# Patient Record
Sex: Male | Born: 1995 | Race: Black or African American | Hispanic: No | Marital: Single | Smoking: Current every day smoker
Health system: Southern US, Community
[De-identification: ages and names within clinical notes are randomized; demographics above are authoritative.]

## PROBLEM LIST (undated history)

## (undated) DIAGNOSIS — D573 Sickle-cell trait: Secondary | ICD-10-CM

## (undated) HISTORY — PX: HERNIA REPAIR: SHX51

## (undated) HISTORY — PX: EYE SURGERY: SHX253

---
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
## 2016-02-28 ENCOUNTER — Emergency Department (HOSPITAL_COMMUNITY): Payer: Self-pay

## 2016-02-28 ENCOUNTER — Emergency Department (HOSPITAL_COMMUNITY)
Admission: EM | Admit: 2016-02-28 | Discharge: 2016-02-28 | Disposition: A | Discharge status: Home / Self Care | Payer: Self-pay | Attending: Emergency Medicine | Admitting: Emergency Medicine

## 2016-02-28 ENCOUNTER — Encounter (HOSPITAL_COMMUNITY): Payer: Self-pay | Admitting: *Deleted

## 2016-02-28 DIAGNOSIS — Y92410 Unspecified street and highway as the place of occurrence of the external cause: Secondary | ICD-10-CM | POA: Insufficient documentation

## 2016-02-28 DIAGNOSIS — S3992XA Unspecified injury of lower back, initial encounter: Secondary | ICD-10-CM | POA: Insufficient documentation

## 2016-02-28 DIAGNOSIS — S99922A Unspecified injury of left foot, initial encounter: Secondary | ICD-10-CM | POA: Insufficient documentation

## 2016-02-28 DIAGNOSIS — Z862 Personal history of diseases of the blood and blood-forming organs and certain disorders involving the immune mechanism: Secondary | ICD-10-CM | POA: Insufficient documentation

## 2016-02-28 DIAGNOSIS — M546 Pain in thoracic spine: Secondary | ICD-10-CM

## 2016-02-28 DIAGNOSIS — S299XXA Unspecified injury of thorax, initial encounter: Secondary | ICD-10-CM | POA: Insufficient documentation

## 2016-02-28 DIAGNOSIS — M25572 Pain in left ankle and joints of left foot: Secondary | ICD-10-CM

## 2016-02-28 DIAGNOSIS — F17210 Nicotine dependence, cigarettes, uncomplicated: Secondary | ICD-10-CM | POA: Insufficient documentation

## 2016-02-28 DIAGNOSIS — Y9389 Activity, other specified: Secondary | ICD-10-CM | POA: Insufficient documentation

## 2016-02-28 DIAGNOSIS — J45909 Unspecified asthma, uncomplicated: Secondary | ICD-10-CM | POA: Insufficient documentation

## 2016-02-28 DIAGNOSIS — Y999 Unspecified external cause status: Secondary | ICD-10-CM | POA: Insufficient documentation

## 2016-02-28 DIAGNOSIS — S99912A Unspecified injury of left ankle, initial encounter: Secondary | ICD-10-CM | POA: Insufficient documentation

## 2016-02-28 MED ORDER — IBUPROFEN 600 MG PO TABS: 600.0000 mg | ORAL_TABLET | Freq: Four times a day (QID) | ORAL | Status: DC | PRN

## 2016-02-28 MED ORDER — IBUPROFEN 400 MG PO TABS
600.0000 mg | ORAL_TABLET | Freq: Once | ORAL | Status: AC
  Administered 2016-02-28: 600 mg via ORAL
  Filled 2016-02-28: qty 1
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
## 2016-02-28 NOTE — ED Notes (Signed)
Pt A&OX4, ambulatory at d/c with steady gait, NAD 
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
## 2016-02-28 NOTE — Discharge Instructions (Signed)
1. Medications: Use ibuprofen as directed, continue usual home medications 2. Treatment: rest, drink plenty of fluids, ice affected area-instructions below. 3. Follow Up: Please follow up with your primary doctor as needed for pain lasting greater than 7 days; Please return to the ER for new or worsening symptoms, any additional concerns  COLD THERAPY DIRECTIONS:  Ice or gel packs can be used to reduce both pain and swelling. Ice is the most helpful within the first 24 to 48 hours after an injury or flareup from overusing a muscle or joint.  Ice is effective, has very few side effects, and is safe for most people to use.   If you expose your skin to cold temperatures for too long or without the proper protection, you can damage your skin or nerves. Watch for signs of skin damage due to cold.   HOME CARE INSTRUCTIONS  Follow these tips to use ice and cold packs safely.  Place a dry or damp towel between the ice and skin. A damp towel will cool the skin more quickly, so you may need to shorten the time that the ice is used.  For a more rapid response, add gentle compression to the ice.  Ice for no more than 10 to 20 minutes at a time. The bonier the area you are icing, the less time it will take to get the benefits of ice.  Check your skin after 5 minutes to make sure there are no signs of a poor response to cold or skin damage.  Rest 20 minutes or more in between uses.  Once your skin is numb, you can end your treatment. You can test numbness by very lightly touching your skin. The touch should be so light that you do not see the skin dimple from the pressure of your fingertip. When using ice, most people will feel these normal sensations in this order: cold, burning, aching, and numbness.
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
## 2016-10-24 ENCOUNTER — Encounter (HOSPITAL_COMMUNITY): Payer: Self-pay | Admitting: *Deleted

## 2016-10-24 ENCOUNTER — Emergency Department (HOSPITAL_COMMUNITY)
Admission: EM | Admit: 2016-10-24 | Discharge: 2016-10-24 | Disposition: A | Discharge status: Home / Self Care | Payer: Self-pay | Attending: Emergency Medicine | Admitting: Emergency Medicine

## 2016-10-24 DIAGNOSIS — K047 Periapical abscess without sinus: Secondary | ICD-10-CM | POA: Insufficient documentation

## 2016-10-24 DIAGNOSIS — F17210 Nicotine dependence, cigarettes, uncomplicated: Secondary | ICD-10-CM | POA: Insufficient documentation

## 2016-10-24 DIAGNOSIS — J45909 Unspecified asthma, uncomplicated: Secondary | ICD-10-CM | POA: Insufficient documentation

## 2016-10-24 MED ORDER — AMOXICILLIN 500 MG PO CAPS: 500.0000 mg | ORAL_CAPSULE | Freq: Three times a day (TID) | ORAL | 0 refills | Status: DC

## 2016-10-24 MED ORDER — OXYCODONE-ACETAMINOPHEN 5-325 MG PO TABS
ORAL_TABLET | ORAL | Status: DC
Start: 2016-10-24 — End: 2016-10-24
  Filled 2016-10-24: qty 1

## 2016-10-24 MED ORDER — TRAMADOL HCL 50 MG PO TABS: 50.0000 mg | ORAL_TABLET | Freq: Four times a day (QID) | ORAL | 0 refills | Status: DC | PRN

## 2016-10-24 MED ORDER — OXYCODONE-ACETAMINOPHEN 5-325 MG PO TABS
1.0000 | ORAL_TABLET | ORAL | Status: DC | PRN
  Administered 2016-10-24: 1 via ORAL

## 2016-10-24 NOTE — ED Triage Notes (Signed)
Patient states he has had a tooth on the left side that has been a problem for a while.  He developed swelling and pain x 2 days.  No difficulty swallowing  Airway is patent. Patient has not taken any meds prior to arrival

## 2016-10-24 NOTE — ED Provider Notes (Signed)
[facility]EMERGENCY DEPT Provider Note   CSN: [id number] Arrival date & time: 10/24/16  1452  By signing my name below, I, Octavia Heirrianna Nassar, attest that this documentation has been prepared under the direction and in the presence of Kerrie BuffaloHope Vinayak Bobier, NP.  Electronically Signed: Octavia HeirArianna Nassar, ED Scribe. 10/24/16. 5:09 PM.    History   Chief Complaint Chief Complaint  Patient presents with  . Dental Pain  . Facial Swelling    The history is provided by the patient. No language interpreter was used.  Dental Pain   This is a new problem. The current episode started more than 2 days ago. The problem occurs constantly. The problem has been gradually worsening. The pain is moderate. He has tried nothing for the symptoms. The treatment provided no relief.   HPI Comments: Jay Booker is a 20 y.o. male who presents to the Emergency Department complaining of left lower dental pain x 4 days. He has associated left sided facial swelling to his jaw and a foul taste in his mouth secondary to drainage from the area. He also states having his back molar pulled halfway out due to not being able to have the whole tooth extracted for about one year. Pt has not taken any medication to alleviate his pain PTA but notes receiving a percocet in triage with mild relief.  Denies fever, chills, nausea, vomiting, abdominal pain, or ear pain. Further denies hx of DM, hx of HTN, or hx of asthma.     Past Medical History:  Diagnosis Date  . Asthma   . Sickle cell trait [facility](HCC)     Patient Active Problem List   Diagnosis Date Noted  . Mood disorder (HCC) 04/17/2015  . Depression     Past Surgical History:  Procedure Laterality Date  . EYE SURGERY    . HERNIA REPAIR         Home Medications    Prior to Admission medications   Medication Sig Start Date End Date Taking? Authorizing Provider  amoxicillin (AMOXIL) 500 MG capsule Take 1 capsule (500 mg total) by mouth 3 (three) times daily. 10/24/16   Shironda Kain Orlene OchM  Neela Zecca, NP  ibuprofen (ADVIL,MOTRIN) 600 MG tablet Take 1 tablet (600 mg total) by mouth every 6 (six) hours as needed. 02/28/16   Chase PicketJaime Pilcher Ward, PA-C  traMADol (ULTRAM) 50 MG tablet Take 1 tablet (50 mg total) by mouth every 6 (six) hours as needed. 10/24/16   Caelan Branden Orlene OchM Chyrl Elwell, NP    Family History No family history on file.  Social History Social History  Substance Use Topics  . Smoking status: Current Every Day Smoker    Packs/day: 0.50    Types: Cigarettes  . Smokeless tobacco: Never Used  . Alcohol use No     Allergies   Shellfish allergy and Aspirin   Review of Systems Review of Systems  Constitutional: Negative for chills and fever.  HENT: Positive for dental problem and facial swelling. Negative for ear pain.   Gastrointestinal: Negative for abdominal pain, nausea and vomiting.  All other systems reviewed and are negative.    Physical Exam Updated Vital Signs BP 135/81 (BP Location: Left Arm)   Pulse 62   Temp 98.1 F (36.7 C) (Oral)   Resp 20   Wt 63.5 kg   SpO2 99%   Physical Exam  Constitutional: He appears well-developed and well-nourished. No distress.  HENT:  Head: Normocephalic and atraumatic.  Mouth/Throat: Oropharynx is clear and moist.  TMs are normal Uvula is  midline, no edema or erythema noted The 1st molar is decayed the 2nd molar is decayed into gumline. There is purulent drainage noted. The 3rd molar is decayed and there is also purulent drainage noted. Facial swelling to the left jaw  Eyes: Conjunctivae are normal. Pupils are equal, round, and reactive to light. Right eye exhibits no discharge. Left eye exhibits no discharge.  Neck: Neck supple.  Cardiovascular: Normal rate, regular rhythm, normal heart sounds and intact distal pulses.  Exam reveals no gallop and no friction rub.   No murmur heard. Pulmonary/Chest: Effort normal and breath sounds normal. No respiratory distress. He has no wheezes. He has no rales.  Abdominal: Soft. There  is no tenderness.  Musculoskeletal: He exhibits no edema.  Lymphadenopathy:    He has cervical adenopathy (left ).  Neurological: He is alert. Coordination normal.  Skin: Skin is warm and dry. No rash noted. He is not diaphoretic. No erythema. No pallor.  Psychiatric: He has a normal mood and affect. His behavior is normal.  Nursing note and vitals reviewed.    ED Treatments / Results  DIAGNOSTIC STUDIES: Oxygen Saturation is 99% on RA, normal by my interpretation.  COORDINATION OF CARE:  4:49 PM Discussed treatment plan which includes antibiotics with pt at bedside and pt agreed to plan.   Radiology No results found.  Procedures Procedures (including critical care time)  Medications Ordered in ED Medications - No data to display   Initial Impression / Assessment and Plan / ED Course  I have reviewed the triage vital signs and the nursing notes.  Clinical Course    Final Clinical Impressions(s) / ED Diagnoses   Final diagnoses:  Dental abscess    Patient with dentalgia.  No abscess requiring immediate incision and drainage.  Exam not concerning for Ludwig's angina or pharyngeal abscess.  Will treat with amoxicillin. Pt instructed to follow-up with dentist.  Discussed return precautions. Pt safe for discharge.  I personally performed the services described in this documentation, which was scribed in my presence. The recorded information has been reviewed and is accurate.  New Prescriptions Discharge Medication List as of 10/24/2016  5:10 PM    START taking these medications   Details  amoxicillin (AMOXIL) 500 MG capsule Take 1 capsule (500 mg total) by mouth 3 (three) times daily., Starting Sat 10/24/2016, Print    traMADol (ULTRAM) 50 MG tablet Take 1 tablet (50 mg total) by mouth every 6 (six) hours as needed., Starting Sat 10/24/2016, Print         DaphneHope M Artie Takayama, TexasNP 10/26/16 [id number]    Nelva Nayobert Beaton, MD 11/08/16 [phone]

## 2017-05-08 ENCOUNTER — Encounter (HOSPITAL_COMMUNITY): Payer: Self-pay | Admitting: Emergency Medicine

## 2017-05-08 ENCOUNTER — Emergency Department (HOSPITAL_COMMUNITY)
Admission: EM | Admit: 2017-05-08 | Discharge: 2017-05-08 | Disposition: A | Discharge status: Home / Self Care | Payer: BLUE CROSS/BLUE SHIELD | Attending: Emergency Medicine | Admitting: Emergency Medicine

## 2017-05-08 DIAGNOSIS — Z79899 Other long term (current) drug therapy: Secondary | ICD-10-CM | POA: Insufficient documentation

## 2017-05-08 DIAGNOSIS — K0889 Other specified disorders of teeth and supporting structures: Secondary | ICD-10-CM | POA: Diagnosis present

## 2017-05-08 DIAGNOSIS — K029 Dental caries, unspecified: Secondary | ICD-10-CM | POA: Diagnosis not present

## 2017-05-08 DIAGNOSIS — J45909 Unspecified asthma, uncomplicated: Secondary | ICD-10-CM | POA: Diagnosis not present

## 2017-05-08 DIAGNOSIS — F17210 Nicotine dependence, cigarettes, uncomplicated: Secondary | ICD-10-CM | POA: Diagnosis not present

## 2017-05-08 MED ORDER — PENICILLIN V POTASSIUM 500 MG PO TABS: 500.0000 mg | ORAL_TABLET | Freq: Four times a day (QID) | ORAL | 0 refills | Status: AC

## 2017-05-08 MED ORDER — PENICILLIN V POTASSIUM 500 MG PO TABS: 500.0000 mg | ORAL_TABLET | Freq: Four times a day (QID) | ORAL | 0 refills | Status: DC

## 2017-05-08 NOTE — Discharge Instructions (Signed)
Take antibiotic for the next week Follow up with Dr. Leanord AsalFarless

## 2017-05-08 NOTE — ED Provider Notes (Signed)
[facility] Provider Note   CSN: [id number] Arrival date & time: 05/08/17  4098     History   Chief Complaint Chief Complaint  Patient presents with  . Jaw Pain  . Dental Problem    HPI Jay Booker is a 21 y.o. male who presents with right lower dental pain. He states that he had his tooth partially pulled a year and a half ago. About a week ago he started to have right lower dental pain with associated swelling. He is taking over-the-counter medicines with no relief. Denies fever, difficulty swallowing, drooling, shortness of breath.  HPI  Past Medical History:  Diagnosis Date  . Asthma   . Sickle cell trait [facility])     Patient Active Problem List   Diagnosis Date Noted  . Mood disorder (HCC) 04/17/2015  . Depression     Past Surgical History:  Procedure Laterality Date  . EYE SURGERY    . HERNIA REPAIR       Home Medications    Prior to Admission medications   Medication Sig Start Date End Date Taking? Authorizing Provider  penicillin v potassium (VEETID) 500 MG tablet Take 1 tablet (500 mg total) by mouth 4 (four) times daily. 05/08/17 05/15/17  Bethel Born, PA-C    Family History No family history on file.  Social History Social History  Substance Use Topics  . Smoking status: Current Every Day Smoker    Packs/day: 0.50    Types: Cigarettes  . Smokeless tobacco: Never Used  . Alcohol use No     Allergies   Shellfish allergy and Aspirin   Review of Systems Review of Systems  Constitutional: Negative for fever.  HENT: Positive for dental problem and facial swelling. Negative for trouble swallowing.   Respiratory: Negative for shortness of breath.      Physical Exam Updated Vital Signs BP (!) 147/79 (BP Location: Right Arm)   Pulse 94   Temp 98.3 F (36.8 C) (Oral)   Resp 16   SpO2 99%   Physical Exam  Constitutional: He is oriented to person, place, and time. He appears well-developed and well-nourished. No distress.    HENT:  Head: Normocephalic and atraumatic.  Mouth/Throat: Oropharynx is clear and moist and mucous membranes are normal. No trismus in the jaw. Abnormal dentition. Dental caries present. No dental abscesses.  Multiple dental caries  Eyes: Conjunctivae are normal. Pupils are equal, round, and reactive to light. Right eye exhibits no discharge. Left eye exhibits no discharge. No scleral icterus.  Neck: Normal range of motion.  Cardiovascular: Normal rate.   Pulmonary/Chest: Effort normal. No respiratory distress.  Abdominal: He exhibits no distension.  Lymphadenopathy:    He has no cervical adenopathy.  Neurological: He is alert and oriented to person, place, and time.  Skin: Skin is warm and dry.  Psychiatric: He has a normal mood and affect. His behavior is normal.  Nursing note and vitals reviewed.    ED Treatments / Results  Labs (all labs ordered are listed, but only abnormal results are displayed) Labs Reviewed - No data to display  EKG  EKG Interpretation None       Radiology No results found.  Procedures Procedures (including critical care time)  Medications Ordered in ED Medications - No data to display   Initial Impression / Assessment and Plan / ED Course  I have reviewed the triage vital signs and the nursing notes.  Pertinent labs & imaging results that were available during my care  of the patient were reviewed by me and considered in my medical decision making (see chart for details).  21 year old male with dental caries and possible dental infection. Patient is afebrile, non toxic appearing, and swallowing secretions well. I gave patient referral to dentist and stressed the importance of dental follow up for ultimate management of dental pain. I have also discussed reasons to return immediately to the ER.  Patient expresses understanding and agrees with plan. Rx for PCN x 1 week given. Return precautions given.   Final Clinical Impressions(s) / ED  Diagnoses   Final diagnoses:  Dental caries    New Prescriptions New Prescriptions   PENICILLIN V POTASSIUM (VEETID) 500 MG TABLET    Take 1 tablet (500 mg total) by mouth 4 (four) times daily.     Bethel BornGekas, Kelly Marie, PA-C 05/08/17 [id number]    Geoffery Lyonselo, [place], MD 05/09/17 [phone]

## 2017-05-08 NOTE — ED Notes (Signed)
Pt did not return to room-left without his rx and his instructions

## 2017-05-08 NOTE — ED Notes (Signed)
Pt not in room.

## 2017-05-08 NOTE — ED Notes (Signed)
Right lower molar broken off at gum line, states his dentist could only take 1/2 of the tooth out last year. Jaw swollen. OTC meds not working.

## 2017-05-08 NOTE — ED Triage Notes (Signed)
Pt. srtated, I went to dentist and they only pulled half my tooth and it hurts and my jaw hurts for 3 days

## 2019-07-06 ENCOUNTER — Emergency Department (HOSPITAL_COMMUNITY)
Admission: EM | Admit: 2019-07-06 | Discharge: 2019-07-06 | Discharge status: Against Medical Advice | Payer: BC Managed Care – PPO | Attending: Emergency Medicine | Admitting: Emergency Medicine

## 2019-07-06 ENCOUNTER — Other Ambulatory Visit: Payer: Self-pay

## 2019-07-06 DIAGNOSIS — J029 Acute pharyngitis, unspecified: Secondary | ICD-10-CM | POA: Diagnosis present

## 2019-07-06 DIAGNOSIS — Z5321 Procedure and treatment not carried out due to patient leaving prior to being seen by health care provider: Secondary | ICD-10-CM | POA: Insufficient documentation

## 2019-07-06 LAB — GROUP A STREP BY PCR: Group A Strep by PCR: NOT DETECTED

## 2019-07-06 NOTE — ED Triage Notes (Signed)
Patient with sore throat, states that he was having a hard time swallowing food.  He states that he works at [employer] and hopefully he didn't cross contaiminate  his food due to being allergic to shellfish.  No hives, no shortness of breath.

## 2019-09-24 ENCOUNTER — Emergency Department (HOSPITAL_COMMUNITY)
Admission: EM | Admit: 2019-09-24 | Discharge: 2019-09-24 | Disposition: A | Discharge status: Home / Self Care | Payer: BC Managed Care – PPO | Attending: Emergency Medicine | Admitting: Emergency Medicine

## 2019-09-24 ENCOUNTER — Encounter (HOSPITAL_COMMUNITY): Payer: Self-pay

## 2019-09-24 DIAGNOSIS — F17210 Nicotine dependence, cigarettes, uncomplicated: Secondary | ICD-10-CM | POA: Insufficient documentation

## 2019-09-24 DIAGNOSIS — J45909 Unspecified asthma, uncomplicated: Secondary | ICD-10-CM | POA: Insufficient documentation

## 2019-09-24 DIAGNOSIS — R112 Nausea with vomiting, unspecified: Secondary | ICD-10-CM | POA: Insufficient documentation

## 2019-09-24 MED ORDER — ONDANSETRON HCL 4 MG PO TABS: 4.0000 mg | ORAL_TABLET | Freq: Three times a day (TID) | ORAL | 0 refills | Status: DC | PRN

## 2019-09-24 MED ORDER — ONDANSETRON HCL 4 MG PO TABS: 4.0000 mg | ORAL_TABLET | Freq: Three times a day (TID) | ORAL | 0 refills | Status: AC | PRN

## 2019-09-24 NOTE — ED Triage Notes (Signed)
Patient states that his job sent him here after vomiting x 2 after eating breakfast. No complaints, no distress. No abd. pain

## 2019-09-24 NOTE — ED Notes (Signed)
Patient verbalizes understanding of discharge instructions . Opportunity for questions and answers were provided . Armband removed by staff ,Pt discharged from ED. W/C  offered at D/C  and Declined W/C at D/C and was escorted to lobby by RN.  

## 2019-09-24 NOTE — ED Provider Notes (Signed)
[facility] EMERGENCY DEPARTMENT Provider Note   CSN: [id number] Arrival date & time: 09/24/19  1045     History   Chief Complaint Chief Complaint  Patient presents with  . emesis after eating    HPI Jay Booker is a 23 y.o. male.     HPI   Patient is a 23 year old male with a history of asthma, sickle cell trait, who presents to the emergency department today for evaluation of nausea and vomiting.  States that he works in [employer] and was at work this morning when he had 3 episodes of vomiting.  He has had no further episodes of vomiting and has been able to tolerate water prior to arrival.  He denies any abdominal pain, diarrhea, constipation, fevers or other associated significant symptoms.  He states that he had plan to just go home take Pepcid and rest however his job told him that he needed to come for an evaluation.  Past Medical History:  Diagnosis Date  . Asthma   . Sickle cell trait [facility])     Patient Active Problem List   Diagnosis Date Noted  . Mood disorder (HCC) 04/17/2015  . Depression     Past Surgical History:  Procedure Laterality Date  . EYE SURGERY    . HERNIA REPAIR        Home Medications    Prior to Admission medications   Medication Sig Start Date End Date Taking? Authorizing Provider  ondansetron (ZOFRAN) 4 MG tablet Take 1 tablet (4 mg total) by mouth every 8 (eight) hours as needed for nausea or vomiting. 09/24/19   Branden Shallenberger S, PA-C    Family History No family history on file.  Social History Social History   Tobacco Use  . Smoking status: Current Every Day Smoker    Packs/day: 0.50    Types: Cigarettes  . Smokeless tobacco: Never Used  Substance Use Topics  . Alcohol use: No  . Drug use: Yes    Types: Marijuana     Allergies   Shellfish allergy and Aspirin   Review of Systems Review of Systems  Constitutional: Negative for fever.  HENT: Negative for sore throat.   Eyes: Negative  for visual disturbance.  Respiratory: Negative for cough and shortness of breath.   Cardiovascular: Negative for chest pain.  Gastrointestinal: Positive for nausea and vomiting. Negative for abdominal pain, constipation and diarrhea.  Genitourinary: Negative for dysuria and hematuria.  Musculoskeletal: Negative for back pain.  Skin: Negative for rash.  Neurological: Negative for syncope and headaches.  All other systems reviewed and are negative.    Physical Exam Updated Vital Signs BP 131/86 (BP Location: Right Arm)   Pulse 84   Temp 98.5 F (36.9 C) (Oral)   Resp 18   SpO2 99%   Physical Exam Vitals signs and nursing note reviewed.  Constitutional:      Appearance: He is well-developed.  HENT:     Head: Normocephalic and atraumatic.  Eyes:     Conjunctiva/sclera: Conjunctivae normal.  Neck:     Musculoskeletal: Neck supple.  Cardiovascular:     Rate and Rhythm: Normal rate and regular rhythm.     Pulses: Normal pulses.     Heart sounds: Normal heart sounds. No murmur.  Pulmonary:     Effort: Pulmonary effort is normal. No respiratory distress.     Breath sounds: Normal breath sounds. No wheezing, rhonchi or rales.  Abdominal:     General: Bowel sounds are  normal.     Palpations: Abdomen is soft.     Tenderness: There is no abdominal tenderness. There is no guarding or rebound.  Skin:    General: Skin is warm and dry.  Neurological:     Mental Status: He is alert.      ED Treatments / Results  Labs (all labs ordered are listed, but only abnormal results are displayed) Labs Reviewed - No data to display  EKG None  Radiology No results found.  Procedures Procedures (including critical care time)  Medications Ordered in ED Medications - No data to display   Initial Impression / Assessment and Plan / ED Course  I have reviewed the triage vital signs and the nursing notes.  Pertinent labs & imaging results that were available during my care of the  patient were reviewed by me and considered in my medical decision making (see chart for details).      Final Clinical Impressions(s) / ED Diagnoses   Final diagnoses:  Non-intractable vomiting with nausea, unspecified vomiting type   Patient with symptoms consistent with viral gastroenteritis.  Vitals are stable, no fever.  No signs of dehydration, tolerating PO fluids prior to arrival.  Lungs are clear.  No focal abdominal pain, no concern for appendicitis, cholecystitis, pancreatitis, ruptured viscus, UTI, kidney stone, or any other abdominal etiology.  I offered to obtain labs and give medications and p.o. challenge in the ED however patient declined and stated he would rather go home and rest.  Supportive therapy indicated with return if symptoms worsen.  Patient counseled.   ED Discharge Orders         Ordered    ondansetron (ZOFRAN) 4 MG tablet  Every 8 hours PRN     09/24/19 1141           Rodney Booze, PA-C 09/24/19 1141    Virgel Manifold, MD 09/24/19 1142

## 2019-09-24 NOTE — Discharge Instructions (Signed)
Take zofran as directed.  ° °Please follow up with your primary doctor within the next 5-7 days.  If you do not have a primary care provider, information for a healthcare clinic has been provided for you to make arrangements for follow up care. Please return to the ER sooner if you have any new or worsening symptoms, or if you have any of the following symptoms: ° °Abdominal pain that does not go away.  °You have a fever.  °You keep throwing up (vomiting).  °The pain is felt only in portions of the abdomen. Pain in the right side could possibly be appendicitis. In an adult, pain in the left lower portion of the abdomen could be colitis or diverticulitis.  °You pass bloody or black tarry stools.  °There is bright red blood in the stool.  °The constipation stays for more than 4 days.  °There is belly (abdominal) or rectal pain.  °You do not seem to be getting better.  °You have any questions or concerns.  ° °
